# Patient Record
Sex: Female | Born: 1980 | Marital: Married | Smoking: Never smoker
Health system: Southern US, Community
[De-identification: ages and names within clinical notes are randomized; demographics above are authoritative.]

---

## 2010-11-13 ENCOUNTER — Other Ambulatory Visit (HOSPITAL_COMMUNITY): Payer: Self-pay | Admitting: Obstetrics and Gynecology

## 2010-11-13 DIAGNOSIS — N979 Female infertility, unspecified: Secondary | ICD-10-CM

## 2010-11-15 ENCOUNTER — Ambulatory Visit (HOSPITAL_COMMUNITY)
Admission: RE | Admit: 2010-11-15 | Discharge: 2010-11-15 | Disposition: A | Discharge status: Home / Self Care | Payer: BC Managed Care – PPO | Source: Ambulatory Visit | Attending: Obstetrics and Gynecology | Admitting: Obstetrics and Gynecology

## 2010-11-15 DIAGNOSIS — N979 Female infertility, unspecified: Secondary | ICD-10-CM | POA: Insufficient documentation

## 2010-11-15 MED ORDER — IOHEXOL 300 MG/ML  SOLN: 14.0000 mL | Freq: Once | INTRAMUSCULAR | Status: AC | PRN

## 2012-08-15 IMAGING — RF DG HYSTEROGRAM
5 series · 5 of 5 positions shown · non-contrast
Comparison: none

CLINICAL DATA: Infertility.

HYSTEROSALPINGOGRAM
TECHNIQUE: Hysterosalpingogram was performed by the ordering
physician under fluoroscopy.  Fluoroscopic images are submitted for
interpretation following the procedure.
Fluoroscopy Time:  0.5 minutes.

[Series 1: run · 1 of 1 slices shown (1 of 5)]
[im 1/1]
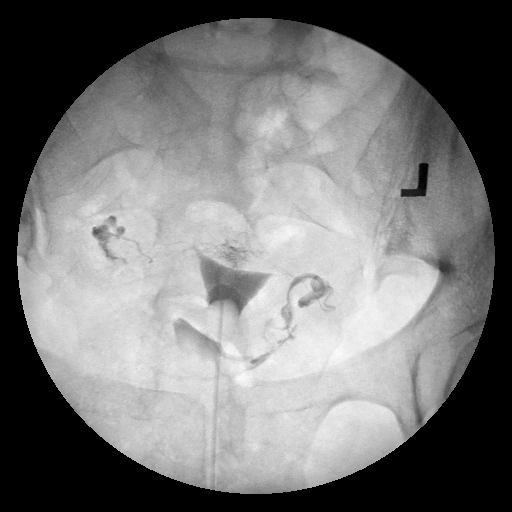

[Series 2: run · 1 of 1 slices shown (2 of 5)]
[im 1/1]
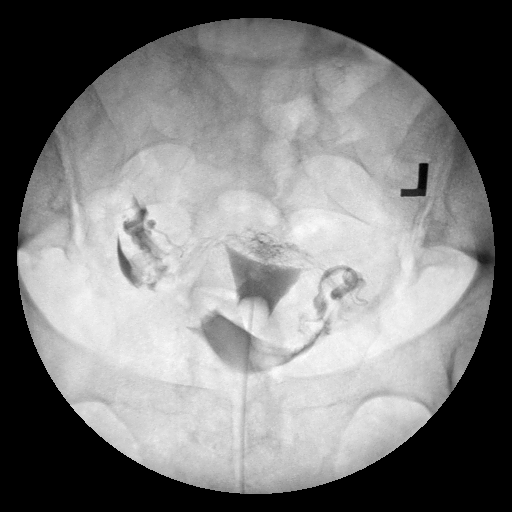

[Series 3: run · 1 of 1 slices shown (3 of 5)]
[im 1/1]
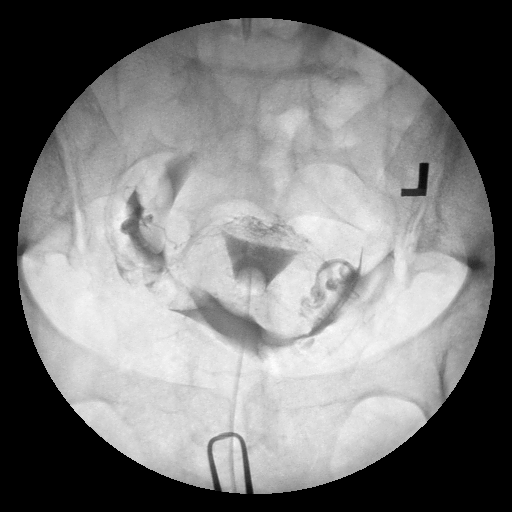

[Series 4: run · 1 of 1 slices shown (4 of 5)]
[im 1/1]
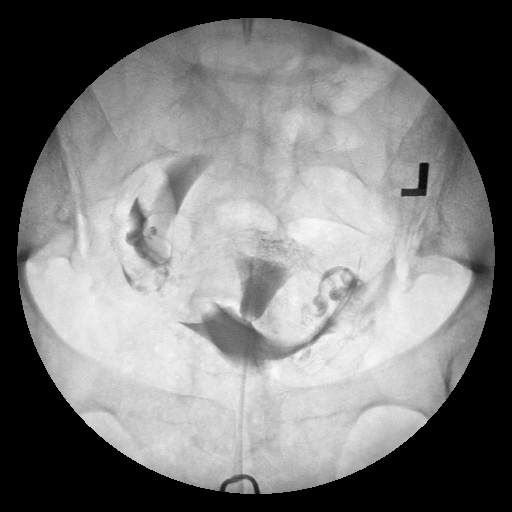

[Series 5: run · 1 of 1 slices shown (5 of 5)]
[im 1/1]
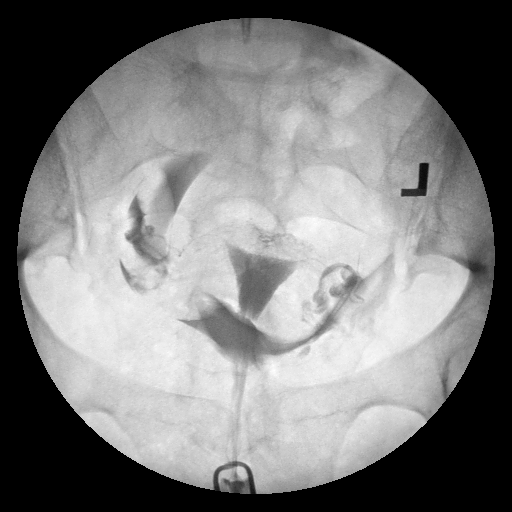

[5 of 5 positions shown; findings below may reference images not displayed]

FINDINGS: The endometrial cavity of the uterus is normal in contour
and appearance. Some lymphatic intravasation of contrast is noted
in the region of the uterine fundus.

Contrast filling of both fallopian tubes is seen, and both tubes
are normal in appearance.  Intraperitoneal spill of the contrast
from both fallopian tubes is demonstrated.
IMPRESSION: Normal study.  Fallopian tubes are patent bilaterally.

## 2017-03-31 HISTORY — PX: GALLBLADDER SURGERY: SHX652

## 2017-03-31 HISTORY — PX: OTHER SURGICAL HISTORY: SHX169

## 2020-11-28 ENCOUNTER — Encounter: Payer: Self-pay | Admitting: Gastroenterology

## 2020-12-07 ENCOUNTER — Ambulatory Visit (INDEPENDENT_AMBULATORY_CARE_PROVIDER_SITE_OTHER): Payer: BC Managed Care – PPO | Admitting: Gastroenterology

## 2020-12-07 ENCOUNTER — Other Ambulatory Visit (INDEPENDENT_AMBULATORY_CARE_PROVIDER_SITE_OTHER): Payer: BC Managed Care – PPO

## 2020-12-07 ENCOUNTER — Encounter: Payer: Self-pay | Admitting: Gastroenterology

## 2020-12-07 VITALS — BP 116/80 | HR 88 | Ht 66.0 in | Wt 225.1 lb

## 2020-12-07 DIAGNOSIS — D509 Iron deficiency anemia, unspecified: Secondary | ICD-10-CM

## 2020-12-07 DIAGNOSIS — K529 Noninfective gastroenteritis and colitis, unspecified: Secondary | ICD-10-CM | POA: Diagnosis not present

## 2020-12-07 LAB — IBC + FERRITIN
Ferritin: 8.4 ng/mL — ABNORMAL LOW (ref 10.0–291.0)
Iron: 36 ug/dL — ABNORMAL LOW (ref 42–145)
Saturation Ratios: 9 % — ABNORMAL LOW (ref 20.0–50.0)
TIBC: 400.4 ug/dL (ref 250.0–450.0)
Transferrin: 286 mg/dL (ref 212.0–360.0)

## 2020-12-07 LAB — CBC WITH DIFFERENTIAL/PLATELET
Basophils Absolute: 0.1 10*3/uL (ref 0.0–0.1)
Basophils Relative: 2.1 % (ref 0.0–3.0)
Eosinophils Absolute: 0.2 10*3/uL (ref 0.0–0.7)
Eosinophils Relative: 4 % (ref 0.0–5.0)
HCT: 35.6 % — ABNORMAL LOW (ref 36.0–46.0)
Hemoglobin: 11.1 g/dL — ABNORMAL LOW (ref 12.0–15.0)
Lymphocytes Relative: 20 % (ref 12.0–46.0)
Lymphs Abs: 1.2 10*3/uL (ref 0.7–4.0)
MCHC: 31.3 g/dL (ref 30.0–36.0)
MCV: 77 fl — ABNORMAL LOW (ref 78.0–100.0)
Monocytes Absolute: 0.7 10*3/uL (ref 0.1–1.0)
Monocytes Relative: 11.4 % (ref 3.0–12.0)
Neutro Abs: 3.7 10*3/uL (ref 1.4–7.7)
Neutrophils Relative %: 62.5 % (ref 43.0–77.0)
Platelets: 260 10*3/uL (ref 150.0–400.0)
RBC: 4.62 Mil/uL (ref 3.87–5.11)
RDW: 16.4 % — ABNORMAL HIGH (ref 11.5–15.5)
WBC: 5.9 10*3/uL (ref 4.0–10.5)

## 2020-12-07 LAB — C-REACTIVE PROTEIN: CRP: <1 mg/dL (ref 0.5–20.0)

## 2020-12-07 LAB — B12 AND FOLATE PANEL
Folate: >24.4 ng/mL (ref >5.9)
Vitamin B-12: 208 pg/mL — ABNORMAL LOW (ref 211–911)

## 2020-12-07 LAB — SEDIMENTATION RATE: Sed Rate: 55 mm/hr — ABNORMAL HIGH (ref 0–20)

## 2020-12-07 NOTE — Patient Instructions (Signed)
Your provider has requested that you go to the basement level for lab work before leaving today. Press "B" on the elevator. The lab is located at the first door on the left as you exit the elevator.  Stop all Ibuprofen & Excedrin. Follow up with PCP about headaches.   Start Investment banker, operational probiotic daily.   If you are age 40 or older, your body mass index should be between 23-30. Your Body mass index is 36.34 kg/m. If this is out of the aforementioned range listed, please consider follow up with your Primary Care Provider.  If you are age 29 or younger, your body mass index should be between 19-25. Your Body mass index is 36.34 kg/m. If this is out of the aformentioned range listed, please consider follow up with your Primary Care Provider.   __________________________________________________________  The [facility] providers would like to encourage you to use [employer] to communicate with providers for non-urgent requests or questions.  Due to long hold times on the telephone, sending your provider a message by [facility] may be a faster and more efficient way to get a response.  Please allow 48 business hours for a response.  Please remember that this is for non-urgent requests.

## 2020-12-07 NOTE — Progress Notes (Signed)
Agree with assessment as outlined with the following thoughts: Labs reviewed, she does have an iron deficiency.  In light of this and her CT findings I do think she warrants a colonoscopy to evaluate this and ensure no evidence of Crohn's disease although I agree that more likely this represents an infectious ileitis or could be related to NSAIDs.  Nonetheless, given iron deficiency EGD and colonoscopy both I recommended.  I would recommend that she stop all NSAIDs for at least 4 weeks prior to proceeding with endoscopic evaluation so we can more accurately assess for Crohn's disease.  Jess can you please contact patient and let her know we recommend EGD and colonoscopy given her iron deficiency anemia.  Thanks

## 2020-12-07 NOTE — Progress Notes (Signed)
12/07/2020 Veronica Brock [id number] [birth year]   HISTORY OF PRESENT ILLNESS: This is a 40 year old female who is new to our office.  She is a Scientist, clinical (histocompatibility and immunogenetics).  She has been referred here by Dr. Logan Bores, surgeon, for evaluation regarding recent CT scan with finding of possible ileitis.  The patient tells me that 5 or 6 weeks ago she had 1 day of vomiting and diarrhea that she thought was a GI bug.  Then a couple weeks ago she had sudden onset of sharp pain in her right lower quadrant.  It was quite severe.  She ended up going to the emergency department having a CT scan of the abdomen and pelvis with contrast that showed potential ileitis findings may be either infectious or inflammatory in nature.  She took 4 days of Cipro and 7 days of Flagyl.  Her pain has resolved.  She says that her bowel habits are still not quite back to normal, but overall much improved.  She is on a probiotic.  Also noted that she had a mildly low hemoglobin at 10.7 g.  She denies any evidence of rectal bleeding.  She says that she had an endometrial ablation in 2019 and has had minimal vaginal bleeding since then.  Hemoglobin was normal in November 2020.  Her MCV is also low at 75.  No iron studies, etc. have been performed.  She does not really follow with a primary care doctor regularly.  She has been using Excedrin daily for headaches and ibuprofen a couple times per week for back pain.    History reviewed. No pertinent past medical history. Past Surgical History:  Procedure Laterality Date   CESAREAN SECTION  2014   GALLBLADDER SURGERY  2019   removal of fallopian tubes  2019    reports that she has never smoked. She has never used smokeless tobacco. No history on file for alcohol use and drug use. family history is not on file. Allergies  Allergen Reactions   Codeine Nausea And Vomiting      Outpatient Encounter Medications as of 12/07/2020  Medication Sig   escitalopram (LEXAPRO) 20 MG tablet Take 20 mg by mouth  daily.   famotidine (PEPCID) 20 MG tablet Take 20 mg by mouth daily.   No facility-administered encounter medications on file as of 12/07/2020.     REVIEW OF SYSTEMS  : All other systems reviewed and negative except where noted in the History of Present Illness.   PHYSICAL EXAM: BP 116/80   Pulse 88   Ht 5\' 6"  (1.676 m)   Wt 225 lb 2 oz (102.1 kg)   BMI 36.34 kg/m  General: Well developed white female in no acute distress Head: Normocephalic and atraumatic Eyes:  Sclerae anicteric, conjunctiva pink. Ears: Normal auditory acuity Lungs: Clear throughout to auscultation; no W/R/R. Heart: Regular rate and rhythm; no M/R/G. Abdomen: Soft, non-distended.  BS present.  Non-tender. Musculoskeletal: Symmetrical with no gross deformities  Skin: No lesions on visible extremities Extremities: No edema  Neurological: Alert oriented x 4, grossly non-focal Psychological:  Alert and cooperative. Normal mood and affect  ASSESSMENT AND PLAN: *40 year old female with finding of potential ileitis on CT scan.  This was seen after developing sudden onset sharp right lower quadrant abdominal pain.  They stated possible infectious versus inflammatory source.  She took 4 days of Cipro and 7 days of Flagyl.  At this point her pain has resolved.  I really think that this was infectious in nature with the  sudden onset of symptoms and quick resolution.  That being said if she has any recurrence of pain or any ongoing bowel issues then could consider colonoscopy with ileoscopy.  We will check a sed rate and CRP.  Suggested Florastor or align probiotic. *Microcytic anemia: Hemoglobin is low at 10.7 g.  MCV low at 75.  I suspect iron deficiency.  We will recheck a CBC and iron studies today as well as a B12 and folate level.  She has not seen any evidence of overt GI bleeding.  She had an endometrial ablation in 2019 so has had very minimal amount of vaginal bleeding.  Hemoglobin in November 2020 was normal.  Pending  results of her labs we may end up doing colonoscopy and endoscopy for evaluation of this issue.  She may have some gastric or small bowel erosions from the ongoing Excedrin/NSAID use.  I am going to check celiac labs as well. *NSAID use: Uses Excedrin daily and ibuprofen a couple times per week for headaches and back pain.  I encouraged her to try to discontinue that.  Needs to establish with a PCP to try to find alternative ways to treat her headaches.   CC:  Hardin Negus, MD

## 2020-12-10 LAB — IGA: Immunoglobulin A: 472 mg/dL — ABNORMAL HIGH (ref 47–310)

## 2020-12-10 LAB — TISSUE TRANSGLUTAMINASE ABS,IGG,IGA
(tTG) Ab, IgA: <1 U/mL
(tTG) Ab, IgG: <1 U/mL
